# Patient Record
Sex: Male | Born: 1935 | Race: White | Hispanic: No | Marital: Married | Smoking: Never smoker
Health system: Southern US, Community
[De-identification: ages and names within clinical notes are randomized; demographics above are authoritative.]

## PROBLEM LIST (undated history)

## (undated) DIAGNOSIS — E119 Type 2 diabetes mellitus without complications: Secondary | ICD-10-CM

## (undated) DIAGNOSIS — R569 Unspecified convulsions: Secondary | ICD-10-CM

## (undated) DIAGNOSIS — I10 Essential (primary) hypertension: Secondary | ICD-10-CM

## (undated) DIAGNOSIS — I2510 Atherosclerotic heart disease of native coronary artery without angina pectoris: Secondary | ICD-10-CM

## (undated) DIAGNOSIS — Z951 Presence of aortocoronary bypass graft: Secondary | ICD-10-CM

## (undated) HISTORY — PX: KNEE ARTHROSCOPY: SUR90

## (undated) HISTORY — PX: APPENDECTOMY: SHX54

---

## 2010-09-13 ENCOUNTER — Ambulatory Visit: Payer: Self-pay | Admitting: Vascular Surgery

## 2011-05-09 NOTE — Consult Note (Signed)
NEW PATIENT CONSULTATION   Adam Hoffman, Adam Hoffman  DOB:  07/22/1935                                       09/13/2010  CHART#:[id number]   This is a 76 year old male patient referred by Dr. Abran Duke for vascular  evaluation of right leg discomfort.  The patient states that he develops  pain in his right leg below the knee laterally about a third of the way  down the calf.  This occurs with standing and is improved slightly with  walking.  He has no pain while laying in the supine position or sitting.  He has no history of nonhealing ulcers, infection, or gangrene.  He does  have a remote history of bilateral vein stripping done in 2003 and has  had varicose veins for many years.  He denies any history of any severe  swelling since his vein stripping procedure.  He has had no history of  bleeding varicosities or ulcers.   CHRONIC MEDICAL PROBLEMS:  1. Type 2 diabetes mellitus.  2. Hypertension.  3. Hyperlipidemia.  4. Coronary artery disease, previous coronary artery bypass graft in      1997.  5. Bilateral cataracts.  6. Early Parkinson's disease.   SOCIAL HISTORY:  He is married, has 1 child, is retired from a funeral  home.  He has not smoked since 1964.  He does not use alcohol.   FAMILY HISTORY:  Positive for diabetes in his mother, coronary artery  disease in his father.  Negative for stroke.   REVIEW OF SYSTEMS:  Positive for weight loss, arthritis, heart murmur,  tremor in his upper extremities.  All other systems are negative on  review of systems.   PHYSICAL EXAM:  Blood pressure 156/81, heart rate 60, temperature 97.7,  respirations 14.  Generally, he is an elderly well-nourished male who is  in no apparent distress, alert and oriented x3.  HEENT exam is normal.  EOMs intact.  Lungs:  Clear to auscultation.  No rhonchi or wheezing.  Cardiovascular:  Regular rhythm.  No murmurs.  Carotid pulses 3+, no  bruits.  Abdomen:  Soft, nontender with no masses.   Musculoskeletal exam  is free of major deformities.  Neurologic exam reveals a typical pill-  rolling tremor in the right upper extremity consistent with Parkinson's  disease.  Lower extremity exam reveals severe hyperpigmentation of the  lower third of both legs due to chronic venous insufficiency with  multiple varicosities in the calf and thigh bilaterally but no distal  edema and no active ulceration.  He has 3+ femoral, popliteal, and  dorsalis pedis pulses palpable bilaterally.   I reviewed all of the records provided by Dr. Bernette Mayers as well as the  arterial studies performed on Apr 26, 2010, which revealed an ABI on the  right of 0.93 and on the left of 0.82.  I also performed at bedside  venous duplex exam with the SonoSite which revealed no evidence of any  residual saphenous vein in either lower extremities.   I do not think his symptoms are due to arterial or venous disease.  They  could be related to his Parkinson disease or more of a musculoskeletal  problem.  No further vascular workup is indicated.     Quita Skye Hart [place], M.D.  Electronically Signed   JDL/MEDQ  D:  09/13/2010  T:  09/14/2010  Job:  [id number]   cc:   Chinita Greenland, MD

## 2014-08-22 ENCOUNTER — Emergency Department (HOSPITAL_COMMUNITY): Payer: Medicare Other

## 2014-08-22 ENCOUNTER — Encounter (HOSPITAL_COMMUNITY): Payer: Self-pay | Admitting: Emergency Medicine

## 2014-08-22 ENCOUNTER — Observation Stay (HOSPITAL_COMMUNITY)
Admission: EM | Admit: 2014-08-22 | Discharge: 2014-08-23 | Disposition: A | Discharge status: Home / Self Care | Payer: Medicare Other | Attending: Internal Medicine | Admitting: Internal Medicine

## 2014-08-22 DIAGNOSIS — G40909 Epilepsy, unspecified, not intractable, without status epilepticus: Secondary | ICD-10-CM | POA: Insufficient documentation

## 2014-08-22 DIAGNOSIS — X500XXA Overexertion from strenuous movement or load, initial encounter: Secondary | ICD-10-CM | POA: Insufficient documentation

## 2014-08-22 DIAGNOSIS — Z79899 Other long term (current) drug therapy: Secondary | ICD-10-CM | POA: Diagnosis not present

## 2014-08-22 DIAGNOSIS — Z7982 Long term (current) use of aspirin: Secondary | ICD-10-CM | POA: Insufficient documentation

## 2014-08-22 DIAGNOSIS — I10 Essential (primary) hypertension: Secondary | ICD-10-CM | POA: Insufficient documentation

## 2014-08-22 DIAGNOSIS — E785 Hyperlipidemia, unspecified: Secondary | ICD-10-CM | POA: Diagnosis present

## 2014-08-22 DIAGNOSIS — Y9389 Activity, other specified: Secondary | ICD-10-CM | POA: Insufficient documentation

## 2014-08-22 DIAGNOSIS — I480 Paroxysmal atrial fibrillation: Secondary | ICD-10-CM

## 2014-08-22 DIAGNOSIS — I25810 Atherosclerosis of coronary artery bypass graft(s) without angina pectoris: Secondary | ICD-10-CM

## 2014-08-22 DIAGNOSIS — G20 Parkinson's disease: Secondary | ICD-10-CM | POA: Diagnosis present

## 2014-08-22 DIAGNOSIS — S32009A Unspecified fracture of unspecified lumbar vertebra, initial encounter for closed fracture: Principal | ICD-10-CM | POA: Insufficient documentation

## 2014-08-22 DIAGNOSIS — Y9289 Other specified places as the place of occurrence of the external cause: Secondary | ICD-10-CM | POA: Diagnosis not present

## 2014-08-22 DIAGNOSIS — IMO0002 Reserved for concepts with insufficient information to code with codable children: Secondary | ICD-10-CM | POA: Insufficient documentation

## 2014-08-22 DIAGNOSIS — I2510 Atherosclerotic heart disease of native coronary artery without angina pectoris: Secondary | ICD-10-CM | POA: Diagnosis not present

## 2014-08-22 DIAGNOSIS — E119 Type 2 diabetes mellitus without complications: Secondary | ICD-10-CM | POA: Diagnosis present

## 2014-08-22 DIAGNOSIS — G20A1 Parkinson's disease without dyskinesia, without mention of fluctuations: Secondary | ICD-10-CM | POA: Diagnosis present

## 2014-08-22 DIAGNOSIS — I4891 Unspecified atrial fibrillation: Secondary | ICD-10-CM | POA: Diagnosis present

## 2014-08-22 DIAGNOSIS — M549 Dorsalgia, unspecified: Secondary | ICD-10-CM | POA: Diagnosis present

## 2014-08-22 DIAGNOSIS — Z951 Presence of aortocoronary bypass graft: Secondary | ICD-10-CM | POA: Diagnosis not present

## 2014-08-22 DIAGNOSIS — T148XXA Other injury of unspecified body region, initial encounter: Secondary | ICD-10-CM

## 2014-08-22 DIAGNOSIS — S32000A Wedge compression fracture of unspecified lumbar vertebra, initial encounter for closed fracture: Secondary | ICD-10-CM | POA: Diagnosis present

## 2014-08-22 HISTORY — DX: Type 2 diabetes mellitus without complications: E11.9

## 2014-08-22 HISTORY — DX: Presence of aortocoronary bypass graft: Z95.1

## 2014-08-22 HISTORY — DX: Atherosclerotic heart disease of native coronary artery without angina pectoris: I25.10

## 2014-08-22 HISTORY — DX: Unspecified convulsions: R56.9

## 2014-08-22 HISTORY — DX: Essential (primary) hypertension: I10

## 2014-08-22 LAB — COMPREHENSIVE METABOLIC PANEL
ALT: <5 U/L (ref 0–53)
AST: 19 U/L (ref 0–37)
Albumin: 3.3 g/dL — ABNORMAL LOW (ref 3.5–5.2)
Alkaline Phosphatase: 90 U/L (ref 39–117)
Anion gap: 14 (ref 5–15)
BUN: 25 mg/dL — ABNORMAL HIGH (ref 6–23)
CHLORIDE: 97 meq/L (ref 96–112)
CO2: 27 mEq/L (ref 19–32)
Calcium: 9.1 mg/dL (ref 8.4–10.5)
Creatinine, Ser: 1.13 mg/dL (ref 0.50–1.35)
GFR calc Af Amer: 70 mL/min — ABNORMAL LOW (ref >90)
GFR calc non Af Amer: 61 mL/min — ABNORMAL LOW (ref >90)
Glucose, Bld: 132 mg/dL — ABNORMAL HIGH (ref 70–99)
Potassium: 4.7 mEq/L (ref 3.7–5.3)
Sodium: 138 mEq/L (ref 137–147)
Total Protein: 7.1 g/dL (ref 6.0–8.3)

## 2014-08-22 LAB — CBC WITH DIFFERENTIAL/PLATELET
Basophils Absolute: 0 10*3/uL (ref 0.0–0.1)
Basophils Relative: 0 % (ref 0–1)
EOS ABS: 0.3 10*3/uL (ref 0.0–0.7)
Eosinophils Relative: 5 % (ref 0–5)
HCT: 39.8 % (ref 39.0–52.0)
Hemoglobin: 13.8 g/dL (ref 13.0–17.0)
LYMPHS ABS: 0.6 10*3/uL — AB (ref 0.7–4.0)
LYMPHS PCT: 11 % — AB (ref 12–46)
MCH: 33.7 pg (ref 26.0–34.0)
MCHC: 34.7 g/dL (ref 30.0–36.0)
MCV: 97.3 fL (ref 78.0–100.0)
Monocytes Absolute: 0.5 10*3/uL (ref 0.1–1.0)
Monocytes Relative: 10 % (ref 3–12)
NEUTROS PCT: 74 % (ref 43–77)
Neutro Abs: 4 10*3/uL (ref 1.7–7.7)
Platelets: 142 10*3/uL — ABNORMAL LOW (ref 150–400)
RBC: 4.09 MIL/uL — ABNORMAL LOW (ref 4.22–5.81)
RDW: 12.9 % (ref 11.5–15.5)
WBC: 5.3 10*3/uL (ref 4.0–10.5)

## 2014-08-22 LAB — URINALYSIS, ROUTINE W REFLEX MICROSCOPIC
Bilirubin Urine: NEGATIVE
Glucose, UA: NEGATIVE mg/dL
Hgb urine dipstick: NEGATIVE
Ketones, ur: NEGATIVE mg/dL
Leukocytes, UA: NEGATIVE
NITRITE: NEGATIVE
Protein, ur: 30 mg/dL — AB
Specific Gravity, Urine: 1.02 (ref 1.005–1.030)
UROBILINOGEN UA: 0.2 mg/dL (ref 0.0–1.0)
pH: 6 (ref 5.0–8.0)

## 2014-08-22 LAB — PROTIME-INR
INR: 3.32 — ABNORMAL HIGH (ref 0.00–1.49)
PROTHROMBIN TIME: 33.7 s — AB (ref 11.6–15.2)

## 2014-08-22 LAB — URINE MICROSCOPIC-ADD ON

## 2014-08-22 MED ORDER — ONDANSETRON HCL 4 MG/2ML IJ SOLN
4.0000 mg | Freq: Once | INTRAMUSCULAR | Status: AC
  Administered 2014-08-22: 4 mg via INTRAVENOUS
  Filled 2014-08-22: qty 2

## 2014-08-22 MED ORDER — HYDROMORPHONE HCL PF 1 MG/ML IJ SOLN
0.5000 mg | Freq: Once | INTRAMUSCULAR | Status: AC
  Administered 2014-08-22: 0.5 mg via INTRAVENOUS

## 2014-08-22 MED ORDER — HYDROMORPHONE HCL PF 1 MG/ML IJ SOLN
0.5000 mg | Freq: Once | INTRAMUSCULAR | Status: AC
  Administered 2014-08-22: 0.5 mg via INTRAVENOUS
  Filled 2014-08-22: qty 1

## 2014-08-22 NOTE — H&P (Signed)
PCP:   VASIREDDY,SABITHA, MD   Chief Complaint:  Back pain  HPI: 79 year old male who    has a past medical history of Hypertension; Diabetes mellitus without complication; Coronary artery disease; S/P triple vessel bypass; and Seizures. today was brought to the ED for worsening back pain. As per patient he fell a week ago while doing the yard work, and has been having pain. Though he is able to walk with help of walker, the pain is severe when he tries to get up from lying position. The pain radiates to left knee and thigh. He denies losing bowel or bladder control. In the ED a CT of the lumbar spine was done which showed subtle compression fracture of L3. Patient was seen by his primary care provider and prescribed lidocaine patch along with Flexeril 10 mg 3 times a day when necessary and Vicodin. Patient now rates the pain as 3-4/10 in intensity, after he received Dilaudid in the ED. He denies nausea vomiting or diarrhea. No chest shortness of breath.   Allergies:  No Known Allergies    Past Medical History  Diagnosis Date  . Hypertension   . Diabetes mellitus without complication   . Coronary artery disease   . S/P triple vessel bypass   . Seizures     Past Surgical History  Procedure Laterality Date  . Appendectomy    . Knee arthroscopy      Prior to Admission medications   Medication Sig Start Date End Date Taking? Authorizing Provider  amiodarone (PACERONE) 200 MG tablet Take 200 mg by mouth daily.   Yes Historical Provider, MD  aspirin EC 81 MG tablet Take 81 mg by mouth daily.   Yes Historical Provider, MD  carbidopa-levodopa (SINEMET IR) 25-100 MG per tablet Take 2 tablets by mouth 3 (three) times daily.   Yes Historical Provider, MD  citalopram (CELEXA) 20 MG tablet Take 20 mg by mouth daily.   Yes Historical Provider, MD  cyclobenzaprine (FLEXERIL) 10 MG tablet Take 10 mg by mouth 3 (three) times daily as needed for muscle spasms.   Yes Historical Provider, MD    HYDROcodone-acetaminophen (NORCO/VICODIN) 5-325 MG per tablet Take 1 tablet by mouth every 6 (six) hours as needed (pain).   Yes Historical Provider, MD  levothyroxine (SYNTHROID, LEVOTHROID) 50 MCG tablet Take 50 mcg by mouth daily before breakfast.   Yes Historical Provider, MD  lidocaine (LIDODERM) 5 % Place 0.5 patches onto the skin daily. Remove & Discard patch within 12 hours or as directed by MD   Yes Historical Provider, MD  metFORMIN (GLUCOPHAGE) 1000 MG tablet Take 1,000 mg by mouth 2 (two) times daily with a meal.   Yes Historical Provider, MD  phenytoin (DILANTIN) 100 MG ER capsule Take 100 mg by mouth 2 (two) times daily.   Yes Historical Provider, MD  rOPINIRole (REQUIP) 2 MG tablet Take 2 mg by mouth 3 (three) times daily.   Yes Historical Provider, MD  simvastatin (ZOCOR) 10 MG tablet Take 10 mg by mouth daily.   Yes Historical Provider, MD  sodium chloride (MURO 128) 5 % ophthalmic solution Place 1 drop into both eyes at bedtime.   Yes Historical Provider, MD  timolol (TIMOPTIC) 0.5 % ophthalmic solution Place 1 drop into both eyes daily.   Yes Historical Provider, MD  warfarin (COUMADIN) 5 MG tablet Take 5-7.5 mg by mouth at bedtime. Takes 7.5mg  on Tues.,Thurs.,Sat. And  all other days   Yes Historical Provider, MD  Social History:  reports that he has never smoked. He does not have any smokeless tobacco history on file. He reports that he does not drink alcohol or use illicit drugs.    All the positives are listed in BOLD  Review of Systems:  HEENT: Headache, blurred vision, runny nose, sore throat Neck: Hypothyroidism, hyperthyroidism,,lymphadenopathy Chest : Shortness of breath, history of COPD, Asthma Heart : Chest pain, history of coronary arterey disease GI:  Nausea, vomiting, diarrhea, constipation, GERD GU: Dysuria, urgency, frequency of urination, hematuria Neuro: Stroke, seizures, syncope Psych: Depression, anxiety, hallucinations   Physical  Exam: Blood pressure 114/57, pulse 67, temperature 98.2 F (36.8 C), temperature source Oral, resp. rate 16, height 6' (1.829 m), weight 83.008 kg (183 lb), SpO2 94.00%. Constitutional:   Patient is a well-developed and well-nourished male in no acute distress and cooperative with exam. Head: Normocephalic and atraumatic Cardiovascular: RRR, S1 normal, S2 normal Pulmonary/Chest: CTAB, no wheezes, rales, or rhonchi Abdominal: Soft. Non-tender, non-distended, bowel sounds are normal, no masses, organomegaly, or guarding present.  Neurological: A&O x3, Strenght is normal and symmetric bilaterally, cranial nerve II-XII are grossly intact, no focal motor deficit, sensory intact to light touch bilaterally.  Extremities : No Cyanosis, Clubbing or Edema  Labs on Admission:  Basic Metabolic Panel:  Recent Labs Lab 08/22/14 1837  NA 138  K 4.7  CL 97  CO2 27  GLUCOSE 132*  BUN 25*  CREATININE 1.13  CALCIUM 9.1   Liver Function Tests:  Recent Labs Lab 08/22/14 1837  AST 19  ALT <5  ALKPHOS 90  BILITOT <0.2*  PROT 7.1  ALBUMIN 3.3*   No results found for this basename: LIPASE, AMYLASE,  in the last 168 hours No results found for this basename: AMMONIA,  in the last 168 hours CBC:  Recent Labs Lab 08/22/14 1837  WBC 5.3  NEUTROABS 4.0  HGB 13.8  HCT 39.8  MCV 97.3  PLT 142*   CRadiological Exams on Admission: Dg Lumbar Spine Complete  08/22/2014   CLINICAL DATA:  Low back pain, left hip pain and left leg pain for the past week. History of trauma from a fall.  EXAM: LUMBAR SPINE - COMPLETE 4+ VIEW  COMPARISON:  No priors.  FINDINGS: There is a severe compression fracture of T12 with complete loss of anterior vertebral body height. No definite paravertebral soft tissue swelling is appreciated to strongly suggest an acute injury. No other definite acute displaced fractures or compression type fractures are noted. Multilevel degenerative disc disease, most severe at L2-L3. Mild  multilevel facet arthropathy. Median sternotomy wires are noted. Multiple surgical clips project over the upper abdomen.  IMPRESSION: 1. Severe compression fracture of T12 with complete loss of anterior vertebral body height. No surrounding soft tissue thickening to strongly suggest an acute injury, however, clinical correlation is recommended as no prior studies are available for comparison. If there is clinical concern for acute injury at the level of T12, and point tenderness in this region, further evaluation with CT scan may be appropriate.   Electronically Signed   By: Trudie Reed M.D.   On: 08/22/2014 17:46   Ct Lumbar Spine Wo Contrast  08/22/2014   CLINICAL DATA:  Low back pain. Recent history of fall. Abnormal lumbar spine radiograph.  EXAM: CT LUMBAR SPINE WITHOUT CONTRAST  TECHNIQUE: Multidetector CT imaging of the lumbar spine was performed without intravenous contrast administration. Multiplanar CT image reconstructions were also generated.  COMPARISON:  Lumbar spine radiograph 08/22/2014.  FINDINGS: There  is a very subtle compression fracture of the L3 vertebral body, with less than 10% loss of vertebral body height. No other acute displaced fractures or other acute compression type fractures are noted. 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 Multilevel degenerative disc disease, most severe at L2-L3. Multilevel facet arthropathy.  Visualized portions of the intra-abdominal contents are generally unremarkable, although there is extensive calcified atherosclerotic plaque in the visualized vasculature, and there is an exophytic 1.6 cm low-attenuation lesion in the upper pole of the left kidney which is incompletely  characterized on this noncontrast CT examination, but statistically likely a small cyst.  IMPRESSION: 1. 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 2. However, there is a very subtle compression fracture of L3 with less than 10% loss of vertebral body height. 3. Mild multilevel degenerative disc disease and lumbar spondylosis. 4. Atherosclerosis. These results were called by telephone at the time of interpretation on 08/22/2014 at 7:25 pm to Dr. Bethann Berkshire, who verbally acknowledged these results.   Electronically Signed   By: Trudie Reed M.D.   On: 08/22/2014 19:28      Assessment/Plan Active Problems:   Compression fracture   Atrial fibrillation   CAD (coronary artery disease)   Hyperlipidemia  Back pain   patient has subtle compression fracture of L3, mild multilevel degenerative disc disease and lumbar spondylosis. We'll start the patient on Dilaudid 1 mg every 4 hours when necessary continue Flexeril and Lidoderm patch. We'll get physical therapy consultation. Patient might need home health PT at the time of discharge.  CAD Patient has history of CAD, stable , continue home  medications including aspirin, Zocor.  Diabetes mellitus Hold metformin and start sliding scale insulin with NovoLog.  Atrial fibrillation Continue the amiodarone 200 mg daily, warfarin per pharmacy consultation  Hyperlipidemia Continue Zocor   Code status: patient is full code   Family discussion: Admission, patients condition and plan of care including tests being ordered have been discussed with the patient and *his wife at bedside who indicate understanding and agree with the plan and Code Status.   Time Spent on Admission:  60 minutes  Dan Dissinger S [facility] Pager: [phone] 08/22/2014, 11:02 PM  If 7PM-7AM, please contact night-coverage  [email or web address]  Password [email or web address]

## 2014-08-22 NOTE — ED Provider Notes (Signed)
CSN: [id number]     Arrival date & time 08/22/14  1628 History  This chart was scribed for Benny Lennert, MD by Modena Jansky, ED Scribe. This patient was seen in room APA12/APA12 and the patient's care was started at 6:21 PM.   Chief Complaint  Patient presents with  . Back Pain   Patient is a 79 y.o. male presenting with back pain. The history is provided by the patient and the spouse. No language interpreter was used.  Back Pain Location:  Generalized Radiates to:  L knee and L thigh Pain severity:  Moderate Onset quality:  Sudden Duration:  1 week Timing:  Constant Progression:  Unchanged Chronicity:  New Context: physical stress   Relieved by:  Nothing Worsened by:  Bending Ineffective treatments:  Muscle relaxants, OTC medications and NSAIDs Associated symptoms: no abdominal pain, no chest pain and no headaches    HPI Comments: Adam Hoffman is a 79 y.o. male who presents to the Emergency Department complaining of constant moderate back pain that started about a week ago. He states that the pain started when he was bending over in the yard. Pt states that he also has pain to his left lower extremity. He states that the LLE pain starts in his left knee cap and radiates upwards. He reports that getting out of his power chair exacerbates the back pain. His spouse reports that his PCP prescribed him hydrocodone, flexeril, and acetaminophen with no relief. She states that the x rays done that day did not show anything abnormal. She reports that pt has a hx of a fall in 2013 where pt had a compression fracture in his back. Pt denies any urinary symptoms.   Past Medical History  Diagnosis Date  . Hypertension   . Diabetes mellitus without complication   . Coronary artery disease   . S/P triple vessel bypass   . Seizures    Past Surgical History  Procedure Laterality Date  . Appendectomy    . Knee arthroscopy     History reviewed. No pertinent family history. History   Substance Use Topics  . Smoking status: Never Smoker   . Smokeless tobacco: Not on file  . Alcohol Use: No    Review of Systems  Constitutional: Negative for appetite change and fatigue.  HENT: Negative for congestion, ear discharge and sinus pressure.   Eyes: Negative for discharge.  Respiratory: Negative for cough.   Cardiovascular: Negative for chest pain.  Gastrointestinal: Negative for abdominal pain and diarrhea.  Genitourinary: Negative for frequency and hematuria.  Musculoskeletal: Positive for back pain and myalgias.  Skin: Negative for rash.  Neurological: Negative for seizures and headaches.  Psychiatric/Behavioral: Negative for hallucinations.    Allergies  Review of patient's allergies indicates no known allergies.  Home Medications   Prior to Admission medications   Medication Sig Start Date End Date Taking? Authorizing Provider  amiodarone (PACERONE) 200 MG tablet Take 200 mg by mouth daily.   Yes Historical Provider, MD  aspirin EC 81 MG tablet Take 81 mg by mouth daily.   Yes Historical Provider, MD  carbidopa-levodopa (SINEMET IR) 25-100 MG per tablet Take 2 tablets by mouth 3 (three) times daily.   Yes Historical Provider, MD  citalopram (CELEXA) 20 MG tablet Take 20 mg by mouth daily.   Yes Historical Provider, MD  cyclobenzaprine (FLEXERIL) 10 MG tablet Take 10 mg by mouth 3 (three) times daily as needed for muscle spasms.   Yes Historical Provider, MD  HYDROcodone-acetaminophen (  NORCO/VICODIN) 5-325 MG per tablet Take 1 tablet by mouth every 6 (six) hours as needed (pain).   Yes Historical Provider, MD  levothyroxine (SYNTHROID, LEVOTHROID) 50 MCG tablet Take 50 mcg by mouth daily before breakfast.   Yes Historical Provider, MD  lidocaine (LIDODERM) 5 % Place 0.5 patches onto the skin daily. Remove & Discard patch within 12 hours or as directed by MD   Yes Historical Provider, MD  metFORMIN (GLUCOPHAGE) 1000 MG tablet Take 1,000 mg by mouth 2 (two) times  daily with a meal.   Yes Historical Provider, MD  phenytoin (DILANTIN) 100 MG ER capsule Take 100 mg by mouth 2 (two) times daily.   Yes Historical Provider, MD  rOPINIRole (REQUIP) 2 MG tablet Take 2 mg by mouth 3 (three) times daily.   Yes Historical Provider, MD  simvastatin (ZOCOR) 10 MG tablet Take 10 mg by mouth daily.   Yes Historical Provider, MD  sodium chloride (MURO 128) 5 % ophthalmic solution Place 1 drop into both eyes at bedtime.   Yes Historical Provider, MD  timolol (TIMOPTIC) 0.5 % ophthalmic solution Place 1 drop into both eyes daily.   Yes Historical Provider, MD  warfarin (COUMADIN) 5 MG tablet Take 5-7.5 mg by mouth at bedtime. Takes 7.5mg  on Tues.,Thurs.,Sat. And  all other days   Yes Historical Provider, MD   BP 115/62  Pulse 73  Temp(Src) 98.2 F (36.8 C) (Oral)  Resp 18  Ht 6' (1.829 m)  Wt 183 lb (83.008 kg)  BMI 24.81 kg/m2  SpO2 96% Physical Exam  Constitutional: He is oriented to person, place, and time. He appears well-developed.  HENT:  Head: Normocephalic.  Eyes: Conjunctivae and EOM are normal. No scleral icterus.  Neck: Neck supple. No thyromegaly present.  Cardiovascular: Normal rate and regular rhythm.  Exam reveals no gallop and no friction rub.   No murmur heard. Pulmonary/Chest: No stridor. He has no wheezes. He has no rales. He exhibits no tenderness.  Abdominal: He exhibits no distension. There is no tenderness. There is no rebound.  Musculoskeletal: Normal range of motion. He exhibits tenderness. He exhibits no edema.  Moderate lower lumbar spine TTP. Moderate weakness all throughout.   Lymphadenopathy:    He has no cervical adenopathy.  Neurological: He is oriented to person, place, and time. He exhibits normal muscle tone. Coordination normal.  Skin: No rash noted. No erythema.  Psychiatric: He has a normal mood and affect. His behavior is normal.    ED Course  Procedures (including critical care time) DIAGNOSTIC STUDIES: Oxygen  Saturation is 96% on RA, normal by my interpretation.    COORDINATION OF CARE: 6:25 PM- Pt advised of plan for treatment which includes medication, radiology, and labs and pt agrees.  Labs Review Labs Reviewed  CBC WITH DIFFERENTIAL - Abnormal; Notable for the following:    RBC 4.09 (*)    Platelets 142 (*)    Lymphocytes Relative 11 (*)    Lymphs Abs 0.6 (*)    All other components within normal limits  PROTIME-INR - Abnormal; Notable for the following:    Prothrombin Time 33.7 (*)    INR 3.32 (*)    All other components within normal limits  COMPREHENSIVE METABOLIC PANEL - Abnormal; Notable for the following:    Glucose, Bld 132 (*)    BUN 25 (*)    Albumin 3.3 (*)    Total Bilirubin <0.2 (*)    GFR calc non Af Amer 61 (*)  GFR calc Af Amer 70 (*)    All other components within normal limits  URINALYSIS, ROUTINE W REFLEX MICROSCOPIC    Imaging Review Dg Lumbar Spine Complete  08/22/2014   CLINICAL DATA:  Low back pain, left hip pain and left leg pain for the past week. History of trauma from a fall.  EXAM: LUMBAR SPINE - COMPLETE 4+ VIEW  COMPARISON:  No priors.  FINDINGS: There is a severe compression fracture of T12 with complete loss of anterior vertebral body height. No definite paravertebral soft tissue swelling is appreciated to strongly suggest an acute injury. No other definite acute displaced fractures or compression type fractures are noted. Multilevel degenerative disc disease, most severe at L2-L3. Mild multilevel facet arthropathy. Median sternotomy wires are noted. Multiple surgical clips project over the upper abdomen.  IMPRESSION: 1. Severe compression fracture of T12 with complete loss of anterior vertebral body height. No surrounding soft tissue thickening to strongly suggest an acute injury, however, clinical correlation is recommended as no prior studies are available for comparison. If there is clinical concern for acute injury at the level of T12, and point  tenderness in this region, further evaluation with CT scan may be appropriate.   Electronically Signed   By: Trudie Reed M.D.   On: 08/22/2014 17:46   Ct Lumbar Spine Wo Contrast  08/22/2014   CLINICAL DATA:  Low back pain. Recent history of fall. Abnormal lumbar spine radiograph.  EXAM: CT LUMBAR SPINE WITHOUT CONTRAST  TECHNIQUE: Multidetector CT imaging of the lumbar spine was performed without intravenous contrast administration. Multiplanar CT image reconstructions were also generated.  COMPARISON:  Lumbar spine radiograph 08/22/2014.  FINDINGS: There is a very subtle compression fracture of the L3 vertebral body, with less than 10% loss of vertebral body height. No other acute displaced fractures or other acute compression type fractures are noted. 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 Multilevel degenerative disc disease, most severe at L2-L3. Multilevel facet arthropathy.  Visualized portions of the intra-abdominal contents are generally unremarkable, although there is extensive calcified atherosclerotic plaque in the visualized vasculature, and there is an exophytic 1.6 cm low-attenuation lesion in the upper pole of the left kidney which is incompletely characterized on this noncontrast CT examination, but statistically likely a small cyst.  IMPRESSION: 1. 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 2. However, there is a very subtle compression fracture of L3 with less than 10% loss of vertebral body height. 3. Mild multilevel degenerative disc disease and lumbar spondylosis. 4. Atherosclerosis. These results were called by  telephone at the time of interpretation on 08/22/2014 at 7:25 pm to Dr. Bethann Berkshire, who verbally acknowledged these results.   Electronically Signed   By: Trudie Reed M.D.   On: 08/22/2014 19:28     EKG Interpretation None      MDM   Final diagnoses:  None    The chart was scribed for me under my direct supervision.  I personally performed the history, physical, and medical decision making and all procedures in the evaluation of this patient.Benny Lennert, MD 08/22/14 2231

## 2014-08-22 NOTE — ED Notes (Signed)
Pt reports back pain radiating to LLE since bending over while in the yard last Saturday. Pt reports seen for same last night at [facility]. Pt reports no relief. nad noted. Pt denies any gu/gi symptoms.

## 2014-08-22 NOTE — ED Notes (Signed)
Pt in xray at time of being roomed.

## 2014-08-23 ENCOUNTER — Encounter (HOSPITAL_COMMUNITY): Payer: Self-pay | Admitting: Emergency Medicine

## 2014-08-23 DIAGNOSIS — G20 Parkinson's disease: Secondary | ICD-10-CM | POA: Diagnosis present

## 2014-08-23 DIAGNOSIS — M549 Dorsalgia, unspecified: Secondary | ICD-10-CM | POA: Diagnosis present

## 2014-08-23 DIAGNOSIS — E119 Type 2 diabetes mellitus without complications: Secondary | ICD-10-CM | POA: Diagnosis present

## 2014-08-23 DIAGNOSIS — S32009A Unspecified fracture of unspecified lumbar vertebra, initial encounter for closed fracture: Secondary | ICD-10-CM | POA: Diagnosis not present

## 2014-08-23 DIAGNOSIS — S32000A Wedge compression fracture of unspecified lumbar vertebra, initial encounter for closed fracture: Secondary | ICD-10-CM | POA: Diagnosis present

## 2014-08-23 LAB — CBC
HEMATOCRIT: 39.7 % (ref 39.0–52.0)
HEMOGLOBIN: 13.5 g/dL (ref 13.0–17.0)
MCH: 33.2 pg (ref 26.0–34.0)
MCHC: 34 g/dL (ref 30.0–36.0)
MCV: 97.5 fL (ref 78.0–100.0)
Platelets: 153 10*3/uL (ref 150–400)
RBC: 4.07 MIL/uL — AB (ref 4.22–5.81)
RDW: 12.9 % (ref 11.5–15.5)
WBC: 4.5 10*3/uL (ref 4.0–10.5)

## 2014-08-23 LAB — GLUCOSE, CAPILLARY
Glucose-Capillary: 122 mg/dL — ABNORMAL HIGH (ref 70–99)
Glucose-Capillary: 131 mg/dL — ABNORMAL HIGH (ref 70–99)

## 2014-08-23 LAB — COMPREHENSIVE METABOLIC PANEL
ALBUMIN: 3.2 g/dL — AB (ref 3.5–5.2)
ALK PHOS: 90 U/L (ref 39–117)
ALT: 8 U/L (ref 0–53)
ANION GAP: 13 (ref 5–15)
AST: 17 U/L (ref 0–37)
BILIRUBIN TOTAL: 0.2 mg/dL — AB (ref 0.3–1.2)
BUN: 23 mg/dL (ref 6–23)
CHLORIDE: 98 meq/L (ref 96–112)
CO2: 28 mEq/L (ref 19–32)
Calcium: 8.9 mg/dL (ref 8.4–10.5)
Creatinine, Ser: 0.97 mg/dL (ref 0.50–1.35)
GFR, EST AFRICAN AMERICAN: 90 mL/min — AB (ref >90)
GFR, EST NON AFRICAN AMERICAN: 78 mL/min — AB (ref >90)
GLUCOSE: 114 mg/dL — AB (ref 70–99)
Potassium: 5 mEq/L (ref 3.7–5.3)
Sodium: 139 mEq/L (ref 137–147)
Total Protein: 6.9 g/dL (ref 6.0–8.3)

## 2014-08-23 LAB — PROTIME-INR
INR: 2.62 — AB (ref 0.00–1.49)
Prothrombin Time: 28 seconds — ABNORMAL HIGH (ref 11.6–15.2)

## 2014-08-23 MED ORDER — PHENYTOIN SODIUM EXTENDED 100 MG PO CAPS
100.0000 mg | ORAL_CAPSULE | Freq: Two times a day (BID) | ORAL | Status: DC
  Administered 2014-08-23 (×2): 100 mg via ORAL
  Filled 2014-08-23 (×2): qty 1

## 2014-08-23 MED ORDER — ASPIRIN EC 81 MG PO TBEC
81.0000 mg | DELAYED_RELEASE_TABLET | Freq: Every day | ORAL | Status: DC
  Administered 2014-08-23: 81 mg via ORAL
  Filled 2014-08-23: qty 1

## 2014-08-23 MED ORDER — CYCLOBENZAPRINE HCL 10 MG PO TABS: 10.0000 mg | ORAL_TABLET | Freq: Three times a day (TID) | ORAL | Status: DC | PRN

## 2014-08-23 MED ORDER — SODIUM CHLORIDE 0.9 % IV SOLN: 250.0000 mL | INTRAVENOUS | Status: DC | PRN

## 2014-08-23 MED ORDER — SODIUM CHLORIDE (HYPERTONIC) 5 % OP SOLN
1.0000 [drp] | Freq: Every day | OPHTHALMIC | Status: DC
  Filled 2014-08-23: qty 15

## 2014-08-23 MED ORDER — LIDOCAINE 5 % EX PTCH
0.5000 | MEDICATED_PATCH | CUTANEOUS | Status: DC
  Administered 2014-08-23: 0.5 via TRANSDERMAL
  Filled 2014-08-23 (×2): qty 1

## 2014-08-23 MED ORDER — INSULIN ASPART 100 UNIT/ML ~~LOC~~ SOLN
0.0000 [IU] | Freq: Three times a day (TID) | SUBCUTANEOUS | Status: DC
  Administered 2014-08-23 (×2): 1 [IU] via SUBCUTANEOUS

## 2014-08-23 MED ORDER — HYDROCODONE-ACETAMINOPHEN 5-325 MG PO TABS: 2.0000 | ORAL_TABLET | ORAL | Status: AC | PRN

## 2014-08-23 MED ORDER — CITALOPRAM HYDROBROMIDE 20 MG PO TABS
20.0000 mg | ORAL_TABLET | Freq: Every day | ORAL | Status: DC
  Administered 2014-08-23: 20 mg via ORAL
  Filled 2014-08-23: qty 1

## 2014-08-23 MED ORDER — WARFARIN - PHARMACIST DOSING INPATIENT: Status: DC

## 2014-08-23 MED ORDER — TIMOLOL MALEATE 0.5 % OP SOLN
1.0000 [drp] | Freq: Every day | OPHTHALMIC | Status: DC
  Administered 2014-08-23: 1 [drp] via OPHTHALMIC
  Filled 2014-08-23: qty 5

## 2014-08-23 MED ORDER — SODIUM CHLORIDE 0.9 % IJ SOLN: 3.0000 mL | INTRAMUSCULAR | Status: DC | PRN

## 2014-08-23 MED ORDER — DOCUSATE SODIUM 100 MG PO CAPS: 100.0000 mg | ORAL_CAPSULE | Freq: Two times a day (BID) | ORAL | Status: AC | PRN

## 2014-08-23 MED ORDER — WARFARIN SODIUM 5 MG PO TABS
5.0000 mg | ORAL_TABLET | Freq: Once | ORAL | Status: AC
  Administered 2014-08-23: 5 mg via ORAL
  Filled 2014-08-23: qty 1

## 2014-08-23 MED ORDER — ROPINIROLE HCL 1 MG PO TABS
2.0000 mg | ORAL_TABLET | Freq: Three times a day (TID) | ORAL | Status: DC
  Administered 2014-08-23 (×2): 2 mg via ORAL
  Filled 2014-08-23 (×4): qty 2

## 2014-08-23 MED ORDER — NAPROXEN 375 MG PO TABS: 375.0000 mg | ORAL_TABLET | Freq: Two times a day (BID) | ORAL | Status: AC

## 2014-08-23 MED ORDER — SODIUM CHLORIDE 0.9 % IJ SOLN
3.0000 mL | Freq: Two times a day (BID) | INTRAMUSCULAR | Status: DC
  Administered 2014-08-23 (×2): 3 mL via INTRAVENOUS

## 2014-08-23 MED ORDER — AMIODARONE HCL 200 MG PO TABS
200.0000 mg | ORAL_TABLET | Freq: Every day | ORAL | Status: DC
  Administered 2014-08-23: 200 mg via ORAL
  Filled 2014-08-23: qty 1

## 2014-08-23 MED ORDER — CARBIDOPA-LEVODOPA 25-100 MG PO TABS
2.0000 | ORAL_TABLET | Freq: Three times a day (TID) | ORAL | Status: DC
  Administered 2014-08-23 (×2): 2 via ORAL
  Filled 2014-08-23 (×2): qty 2

## 2014-08-23 MED ORDER — LEVOTHYROXINE SODIUM 50 MCG PO TABS
50.0000 ug | ORAL_TABLET | Freq: Every day | ORAL | Status: DC
  Administered 2014-08-23: 50 ug via ORAL
  Filled 2014-08-23: qty 1

## 2014-08-23 MED ORDER — HYDROMORPHONE HCL PF 1 MG/ML IJ SOLN
1.0000 mg | INTRAMUSCULAR | Status: DC | PRN
  Administered 2014-08-23: 1 mg via INTRAVENOUS
  Filled 2014-08-23: qty 1

## 2014-08-23 MED ORDER — LIDOCAINE 5 % EX PTCH
MEDICATED_PATCH | CUTANEOUS | Status: AC
  Filled 2014-08-23: qty 1

## 2014-08-23 MED ORDER — SIMVASTATIN 10 MG PO TABS
10.0000 mg | ORAL_TABLET | Freq: Every day | ORAL | Status: DC
  Administered 2014-08-23: 10 mg via ORAL
  Filled 2014-08-23 (×2): qty 1

## 2014-08-23 NOTE — Discharge Instructions (Signed)
Vertebral Fracture  You have a fracture of one or more vertebra. These are the bony parts that form the spine. Minor vertebral fractures happen when people fall. Osteoporosis is associated with many of these fractures. Hospital care may not be necessary for minor compression fractures that are stable. However, multiple fractures of the spine or unstable injuries can cause severe pain and even damage the spinal cord. A spinal cord injury may cause paralysis, numbness, or loss of normal bowel and bladder control.   Normally there is pain and stiffness in the back for 3 to 6 weeks after a vertebral fracture. Bed rest for several days, pain medicine, and a slow return to activity is often the only treatment that is needed depending on the location of the fracture. Neck and back braces may be helpful in reducing pain and increasing mobility. When your pain allows, you should begin walking or swimming to help maintain your endurance. Exercises to improve motion and to strengthen the back may also be useful after the initial pain improves. Treatment for osteoporosis may be essential for full recovery. This will help reduce your risk of vertebral fractures with a future fall.  During the first few days after a spine fracture you may feel nauseated or vomit. If this is severe, hospital care with IV fluids will be needed.   Arrange for follow-up care as recommended to assure proper long-term care and prevention of further spine injury.   SEEK IMMEDIATE MEDICAL CARE IF:   You have increasing pain, vomiting, or are unable to move around at all.   You develop numbness, tingling, weakness, or paralysis of any part of your body.   You develop a loss of normal bowel or bladder control.   You have difficulty breathing, cough, fever, chest or abdominal pain.  MAKE SURE YOU:    Understand these instructions.   Will watch your condition.   Will get help right away if you are not doing well or get worse.  Document Released:  01/18/2005 Document Revised: 03/04/2012 Document Reviewed: 08/03/2009  [employer] Patient Information 2015 [employer]. This information is not intended to replace advice given to you by your health care provider. Make sure you discuss any questions you have with your health care provider.

## 2014-08-23 NOTE — Discharge Summary (Addendum)
Physician Discharge Summary  Adam Hoffman:[id number] DOB: 24-Apr-1935 DOA: 08/22/2014  PCP: Craig Staggers, MD  Admit date: 08/22/2014 Discharge date: 08/23/2014  Time spent: 25 minutes  Recommendations for Outpatient Follow-up:  1. Discharge home with home health PT  Discharge Diagnoses:  Principal Problem:   Lumbar compression fracture  Active Problems:   Atrial fibrillation   CAD (coronary artery disease)   Hyperlipidemia   Parkinson disease   Back pain without radiation   Type II or unspecified type diabetes mellitus without mention of complication, not stated as uncontrolled   Discharge Condition: Fair  Diet recommendation: Diabetic  Filed Weights   08/22/14 1637 08/23/14 0112  Weight: 83.008 kg (183 lb) 82.237 kg (181 lb 4.8 oz)    History of present illness:  Please refer to admission H&P for details, but in brief,79 year old male who has a past medical history of Hypertension; Diabetes mellitus without complication; Coronary artery disease; S/P triple vessel bypass; and Seizures  was brought to the ED for worsening back pain. As per patient he fell a week ago while doing the yard work, and has been having pain. Though he is able to walk with help of walker, the pain is severe when he tries to get up from lying position. The pain radiated  to left knee and thigh. He denies bowel or bladder symptoms.. In the ED a CT of the lumbar spine was done which showed subtle compression fracture of L3. Patient was seen by his primary care provider 3 days at admission and prescribed lidocaine patch along with Flexeril 10 mg 3 times a day when necessary and Vicodin.  Patient denied any other symptoms and was admitted to medical floor for observation and pain control   Hospital Course:  Back pain secondary to L3 compression fracture patient has subtle compression fracture of L3, mild multilevel degenerative disc disease and lumbar spondylosis. -patient placed on on Dilaudid 1  mg every 4 hours when necessary continue Flexeril and Lidoderm patch. Seen by physical therapy and recommended home health PT. Patient was able to ablate with the help of a walker His pain is much improved now and he wishes to go home. I will increase his Vicodin dose to 5/325 mg, 2 tablets every 4 hours as needed (was receiving 1 tablet every 6 hours as needed ) at home. I would also add Naprosyn 325 mg twice daily for 5 days . -follow up with his PCP in one week. -Arrange home health PT upon discharge  CAD / history of CABG stable . Continue aspirin and statin.   Diabetes mellitus  Continue metformin  Atrial fibrillation  Rate controlled. Continue warfarin and amiodarone  Hyperlipidemia  Continue Zocor      Procedures:  None  Consultations:  None   Diet: Diabetic CODE STATUS: Full code  Family communication Discussed with wife at bedside Discharge Exam: Filed Vitals:   08/23/14 1511  BP: 135/65  Pulse: 65  Temp: 98.3 F (36.8 C)  Resp: 18    General: Elderly male lying in bed in no acute distress HEENT: No pallor, moist oral mucosa Chest: Clear to auscultation bilaterally CVS: S1 and S2 irregular, no murmurs Abdomen: Soft, nontender, nondistended, bowel sounds present Extremities: Warm, SLTR negative, mild low back tenderness, normal sensations in bilateral lower extremities CNS: Alert and oriented  Discharge Instructions You were cared for by a hospitalist during your hospital stay. If you have any questions about your discharge medications or the care you received while you were in the  hospital after you are discharged, you can call the unit and asked to speak with the hospitalist on call if the hospitalist that took care of you is not available. Once you are discharged, your primary care physician will handle any further medical issues. Please note that NO REFILLS for any discharge medications will be authorized once you are discharged, as it is imperative  that you return to your primary care physician (or establish a relationship with a primary care physician if you do not have one) for your aftercare needs so that they can reassess your need for medications and monitor your lab values.     Medication List         amiodarone 200 MG tablet  Commonly known as:  PACERONE  Take 200 mg by mouth daily.     aspirin EC 81 MG tablet  Take 81 mg by mouth daily.     carbidopa-levodopa 25-100 MG per tablet  Commonly known as:  SINEMET IR  Take 2 tablets by mouth 3 (three) times daily.     citalopram 20 MG tablet  Commonly known as:  CELEXA  Take 20 mg by mouth daily.     cyclobenzaprine 10 MG tablet  Commonly known as:  FLEXERIL  Take 10 mg by mouth 3 (three) times daily as needed for muscle spasms.     docusate sodium 100 MG capsule  Commonly known as:  COLACE  Take 1 capsule (100 mg total) by mouth 2 (two) times daily as needed for mild constipation.     HYDROcodone-acetaminophen 5-325 MG per tablet  Commonly known as:  NORCO/VICODIN  Take 2 tablets by mouth every 4 (four) hours as needed for moderate pain (pain).     levothyroxine 50 MCG tablet  Commonly known as:  SYNTHROID, LEVOTHROID  Take 50 mcg by mouth daily before breakfast.     lidocaine 5 %  Commonly known as:  LIDODERM  Place 0.5 patches onto the skin daily. Remove & Discard patch within 12 hours or as directed by MD     metFORMIN 1000 MG tablet  Commonly known as:  GLUCOPHAGE  Take 1,000 mg by mouth 2 (two) times daily with a meal.     naproxen 375 MG tablet  Commonly known as:  NAPROSYN  Take 1 tablet (375 mg total) by mouth 2 (two) times daily with a meal.     phenytoin 100 MG ER capsule  Commonly known as:  DILANTIN  Take 100 mg by mouth 2 (two) times daily.     rOPINIRole 2 MG tablet  Commonly known as:  REQUIP  Take 2 mg by mouth 3 (three) times daily.     simvastatin 10 MG tablet  Commonly known as:  ZOCOR  Take 10 mg by mouth daily.     sodium  chloride 5 % ophthalmic solution  Commonly known as:  MURO 128  Place 1 drop into both eyes at bedtime.     timolol 0.5 % ophthalmic solution  Commonly known as:  TIMOPTIC  Place 1 drop into both eyes daily.     warfarin 5 MG tablet  Commonly known as:  COUMADIN  Take 5-7.5 mg by mouth at bedtime. Takes 7.5mg  on Tues.,Thurs.,Sat. And  all other days       No Known Allergies     Follow-up Information   Follow up with VASIREDDY,SABITHA, MD. Schedule an appointment as soon as possible for a visit in 1 week.   Specialty:  Internal Medicine  Contact information:   [address] [place] [phone]        The results of significant diagnostics from this hospitalization (including imaging, microbiology, ancillary and laboratory) are listed below for reference.    Significant Diagnostic Studies: Dg Lumbar Spine Complete  08/22/2014   CLINICAL DATA:  Low back pain, left hip pain and left leg pain for the past week. History of trauma from a fall.  EXAM: LUMBAR SPINE - COMPLETE 4+ VIEW  COMPARISON:  No priors.  FINDINGS: There is a severe compression fracture of T12 with complete loss of anterior vertebral body height. No definite paravertebral soft tissue swelling is appreciated to strongly suggest an acute injury. No other definite acute displaced fractures or compression type fractures are noted. Multilevel degenerative disc disease, most severe at L2-L3. Mild multilevel facet arthropathy. Median sternotomy wires are noted. Multiple surgical clips project over the upper abdomen.  IMPRESSION: 1. Severe compression fracture of T12 with complete loss of anterior vertebral body height. No surrounding soft tissue thickening to strongly suggest an acute injury, however, clinical correlation is recommended as no prior studies are available for comparison. If there is clinical concern for acute injury at the level of T12, and point tenderness in this region, further evaluation with CT  scan may be appropriate.   Electronically Signed   By: Trudie Reed M.D.   On: 08/22/2014 17:46   Ct Lumbar Spine Wo Contrast  08/22/2014   CLINICAL DATA:  Low back pain. Recent history of fall. Abnormal lumbar spine radiograph.  EXAM: CT LUMBAR SPINE WITHOUT CONTRAST  TECHNIQUE: Multidetector CT imaging of the lumbar spine was performed without intravenous contrast administration. Multiplanar CT image reconstructions were also generated.  COMPARISON:  Lumbar spine radiograph 08/22/2014.  FINDINGS: There is a very subtle compression fracture of the L3 vertebral body, with less than 10% loss of vertebral body height. No other acute displaced fractures or other acute compression type fractures are noted. 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 Multilevel degenerative disc disease, most severe at L2-L3. Multilevel facet arthropathy.  Visualized portions of the intra-abdominal contents are generally unremarkable, although there is extensive calcified atherosclerotic plaque in the visualized vasculature, and there is an exophytic 1.6 cm low-attenuation lesion in the upper pole of the left kidney which is incompletely characterized on this noncontrast CT examination, but statistically likely a small cyst.  IMPRESSION: 1. 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 2. However, there is a very subtle compression fracture of L3 with less than 10% loss of vertebral body height. 3. Mild multilevel degenerative disc disease and lumbar spondylosis. 4. Atherosclerosis. These results were called by telephone at the time of interpretation on 08/22/2014 at 7:25  pm to Dr. Bethann Berkshire, who verbally acknowledged these results.   Electronically Signed   By: Trudie Reed M.D.   On: 08/22/2014 19:28    Microbiology: No results found for this or any previous visit (from the past 240 hour(s)).   Labs: Basic Metabolic Panel:  Recent Labs Lab 08/22/14 1837 08/23/14 0607  NA 138 139  K 4.7 5.0  CL 97 98  CO2 27 28  GLUCOSE 132* 114*  BUN 25* 23  CREATININE 1.13 0.97  CALCIUM 9.1 8.9   Liver Function Tests:  Recent Labs Lab 08/22/14 1837 08/23/14 0607  AST 19 17  ALT <5 8  ALKPHOS 90 90  BILITOT <0.2* 0.2*  PROT 7.1 6.9  ALBUMIN 3.3* 3.2*   No results found for this basename: LIPASE, AMYLASE,  in the last 168 hours No results found for this basename: AMMONIA,  in the last 168 hours CBC:  Recent Labs Lab 08/22/14 1837 08/23/14 0607  WBC 5.3 4.5  NEUTROABS 4.0  --   HGB 13.8 13.5  HCT 39.8 39.7  MCV 97.3 97.5  PLT 142* 153   Cardiac Enzymes: No results found for this basename: CKTOTAL, CKMB, CKMBINDEX, TROPONINI,  in the last 168 hours BNP: BNP (last 3 results) No results found for this basename: PROBNP,  in the last 8760 hours CBG:  Recent Labs Lab 08/23/14 0729 08/23/14 1131  GLUCAP 122* 131*       Signed:  Noble Cicalese  [facility] 08/23/2014, 3:31 PM

## 2014-08-23 NOTE — Progress Notes (Addendum)
ANTICOAGULATION CONSULT NOTE  Pharmacy Consult for Warfarin Indication: atrial fibrillation  No Known Allergies  Patient Measurements: Height: 6' (182.9 cm) Weight: 181 lb 4.8 oz (82.237 kg) IBW/kg (Calculated) : 77.6  Vital Signs: Temp: 98.4 F (36.9 C) (08/30 0646) Temp src: Oral (08/30 0646) BP: 132/64 mmHg (08/30 0646) Pulse Rate: 63 (08/30 0646)  Labs:  Recent Labs  08/22/14 1837 08/23/14 0607 08/23/14 1238  HGB 13.8 13.5  --   HCT 39.8 39.7  --   PLT 142* 153  --   LABPROT 33.7*  --  28.0*  INR 3.32*  --  2.62*  CREATININE 1.13 0.97  --     Estimated Creatinine Clearance: 70 ml/min (by C-G formula based on Cr of 0.97).   Medical History: Past Medical History  Diagnosis Date  . Hypertension   . Diabetes mellitus without complication   . Coronary artery disease   . S/P triple vessel bypass   . Seizures     Medications:  Prescriptions prior to admission  Medication Sig Dispense Refill  . amiodarone (PACERONE) 200 MG tablet Take 200 mg by mouth daily.      Marland Kitchen aspirin EC 81 MG tablet Take 81 mg by mouth daily.      . carbidopa-levodopa (SINEMET IR) 25-100 MG per tablet Take 2 tablets by mouth 3 (three) times daily.      . citalopram (CELEXA) 20 MG tablet Take 20 mg by mouth daily.      . cyclobenzaprine (FLEXERIL) 10 MG tablet Take 10 mg by mouth 3 (three) times daily as needed for muscle spasms.      Marland Kitchen HYDROcodone-acetaminophen (NORCO/VICODIN) 5-325 MG per tablet Take 1 tablet by mouth every 6 (six) hours as needed (pain).      Marland Kitchen levothyroxine (SYNTHROID, LEVOTHROID) 50 MCG tablet Take 50 mcg by mouth daily before breakfast.      . lidocaine (LIDODERM) 5 % Place 0.5 patches onto the skin daily. Remove & Discard patch within 12 hours or as directed by MD      . metFORMIN (GLUCOPHAGE) 1000 MG tablet Take 1,000 mg by mouth 2 (two) times daily with a meal.      . phenytoin (DILANTIN) 100 MG ER capsule Take 100 mg by mouth 2 (two) times daily.      Marland Kitchen rOPINIRole  (REQUIP) 2 MG tablet Take 2 mg by mouth 3 (three) times daily.      . simvastatin (ZOCOR) 10 MG tablet Take 10 mg by mouth daily.      . sodium chloride (MURO 128) 5 % ophthalmic solution Place 1 drop into both eyes at bedtime.      . timolol (TIMOPTIC) 0.5 % ophthalmic solution Place 1 drop into both eyes daily.      Marland Kitchen warfarin (COUMADIN) 5 MG tablet Take 5-7.5 mg by mouth at bedtime. Takes 7.5mg  on Tues.,Thurs.,Sat. And  all other days        Assessment: Okay for Protocol, INR elevated on admission.  Goal of Therapy:  INR 2-3   Plan:  Warfarin  PO x 1 today Daily INR  Mady Gemma 08/23/2014,2:55 PM

## 2014-08-23 NOTE — Progress Notes (Signed)
UR completed 

## 2014-08-23 NOTE — Evaluation (Signed)
Physical Therapy Evaluation Patient Details Name: Jered Heiny MRN: [id number] DOB: Sep 21, 1935 Today's Date: 08/23/2014   History of Present Illness  79 year old male who    has a past medical history of Hypertension; Diabetes mellitus without complication; Coronary artery disease; S/P triple vessel bypass; and Seizures. today was brought to the ED for worsening back pain. As per patient he fell a week ago while doing the yard work, and has been having pain. Though he is able to walk with help of walker, the pain is severe when he tries to get up from lying position. The pain radiates to left knee and thigh. He denies losing bowel or bladder control. In the ED a CT of the lumbar spine was done which showed subtle compression fracture of L3  Clinical Impression  Pt main limitation is pain.  Therapist explained to pt and wife that this activity is painful and it will take time before the acute pain is gone but the pt must move or he will be susceptible to having secondary problems.  Pt and wife verbalized understanding of the need to get up and move at home.     Follow Up Recommendations Home health PT    Equipment Recommendations  None recommended by PT    Recommendations for Other Services OT consult     Precautions / Restrictions Precautions Precautions: Fall Restrictions Weight Bearing Restrictions: No      Mobility  Bed Mobility Overal bed mobility: Needs Assistance Bed Mobility: Rolling;Sidelying to Sit Rolling: Mod assist Sidelying to sit: Mod assist          Transfers   Equipment used: Rolling walker (2 wheeled)                Ambulation/Gait Ambulation/Gait assistance: Supervision Ambulation Distance (Feet): 35 Feet Assistive device: Rolling walker (2 wheeled)     Gait velocity interpretation: Below normal speed for age/gender                Pertinent Vitals/Pain Pain Assessment: 0-10 Pain Score: 10-Worst pain ever Pain Descriptors /  Indicators: Stabbing Pain Intervention(s): Repositioned    Home Living Family/patient expects to be discharged to:: Private residence Living Arrangements: Spouse/significant other Available Help at Discharge: Family Type of Home: House Home Access: Level entry     Home Layout: One level Home Equipment: Environmental consultant - 2 wheels;Bedside commode      Prior Function Level of Independence: Independent with assistive device(s)                  Extremity/Trunk Assessment               Lower Extremity Assessment: Generalized weakness         Communication   Communication: No difficulties  Cognition Arousal/Alertness: Awake/alert Behavior During Therapy: WFL for tasks assessed/performed Overall Cognitive Status: Within Functional Limits for tasks assessed                      General Comments      Exercises General Exercises - Lower Extremity Ankle Circles/Pumps: Both;10 reps Quad Sets: Both;10 reps Gluteal Sets: Both;10 reps      Assessment/Plan    PT Assessment Patient needs continued PT services  PT Diagnosis Difficulty walking;Generalized weakness;Acute pain   PT Problem List Decreased strength;Decreased activity tolerance;Pain  PT Treatment Interventions Therapeutic exercise;Gait training   PT Goals (Current goals can be found in the Care Plan section) Acute Rehab PT Goals Patient Stated Goal: to be able to  walk again Time For Goal Achievement: 08/24/14 Potential to Achieve Goals: Good    Frequency Min 6X/week           End of Session Equipment Utilized During Treatment: Gait belt Activity Tolerance: Patient limited by pain Patient left: in bed;with call bell/phone within reach;with family/visitor present      Functional Assessment Tool Used: clinical judgement Functional Limitation: Mobility: Walking and moving around Mobility: Walking and Moving Around Current Status ([id number]): At least 60 percent but less than 80 percent impaired,  limited or restricted Mobility: Walking and Moving Around Goal Status [phone]): At least 60 percent but less than 80 percent impaired, limited or restricted Mobility: Walking and Moving Around Discharge Status [phone]): At least 60 percent but less than 80 percent impaired, limited or restricted    Time: 1120-1200 PT Time Calculation (min): 40 min   Charges:   PT Evaluation $Initial PT Evaluation Tier I: 1 Procedure     PT G Codes:   Functional Assessment Tool Used: clinical judgement Functional Limitation: Mobility: Walking and moving around    RUSSELL,CINDY 08/23/2014, 12:46 PM

## 2015-05-26 IMAGING — CR DG LUMBAR SPINE COMPLETE 4+V
5 series · 5 of 5 positions shown · non-contrast
Comparison: No priors.

CLINICAL DATA: Low back pain, left hip pain and left leg pain for
the past week. History of trauma from a fall.

EXAM:
LUMBAR SPINE - COMPLETE 4+ VIEW

[view not recorded (1 of 5)]
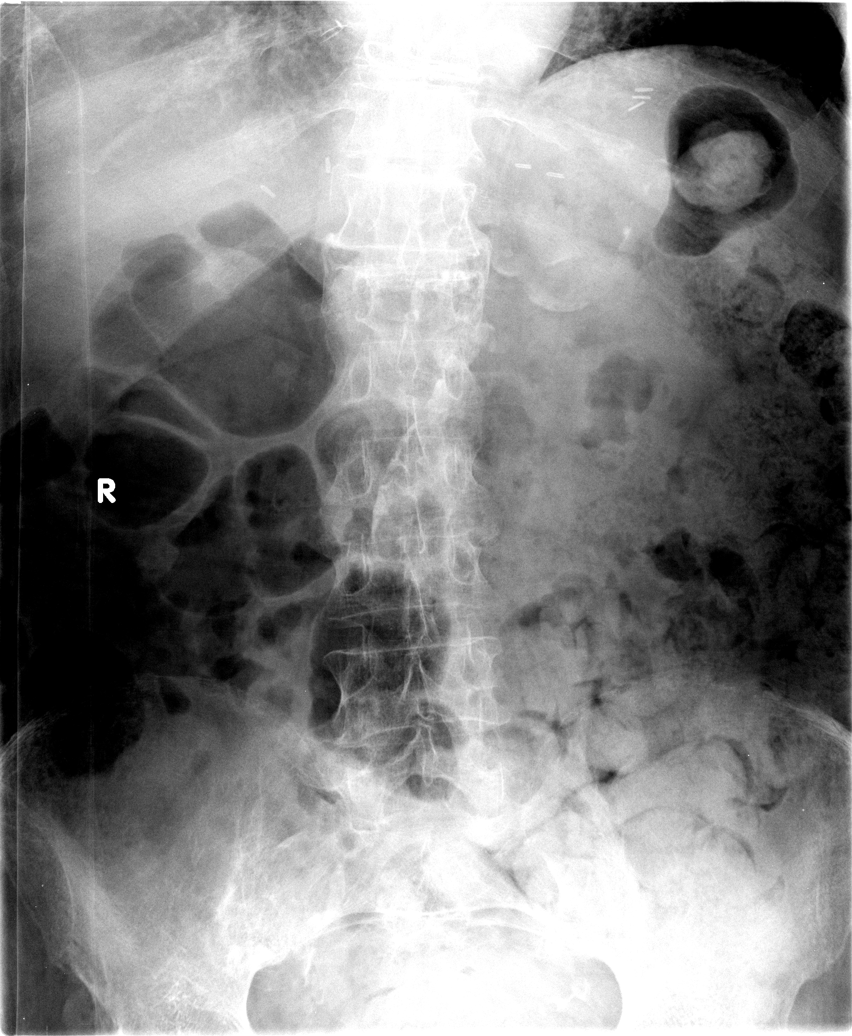

[view not recorded (2 of 5)]
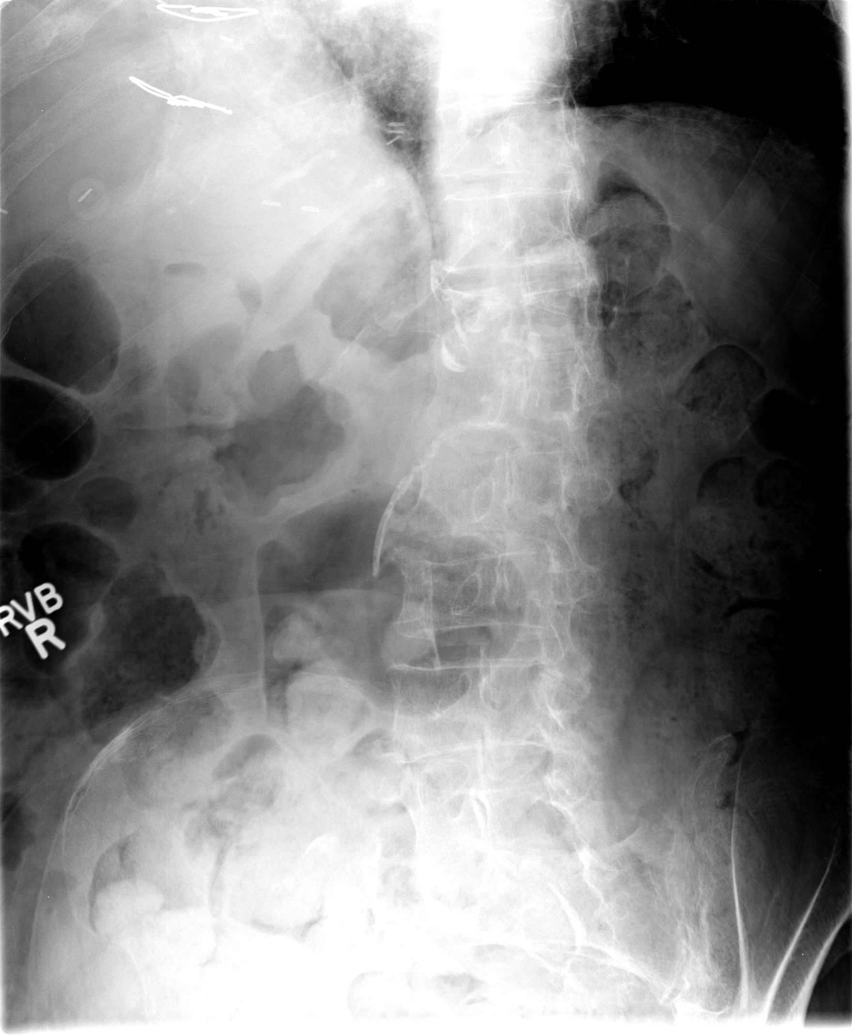

[view not recorded (3 of 5)]
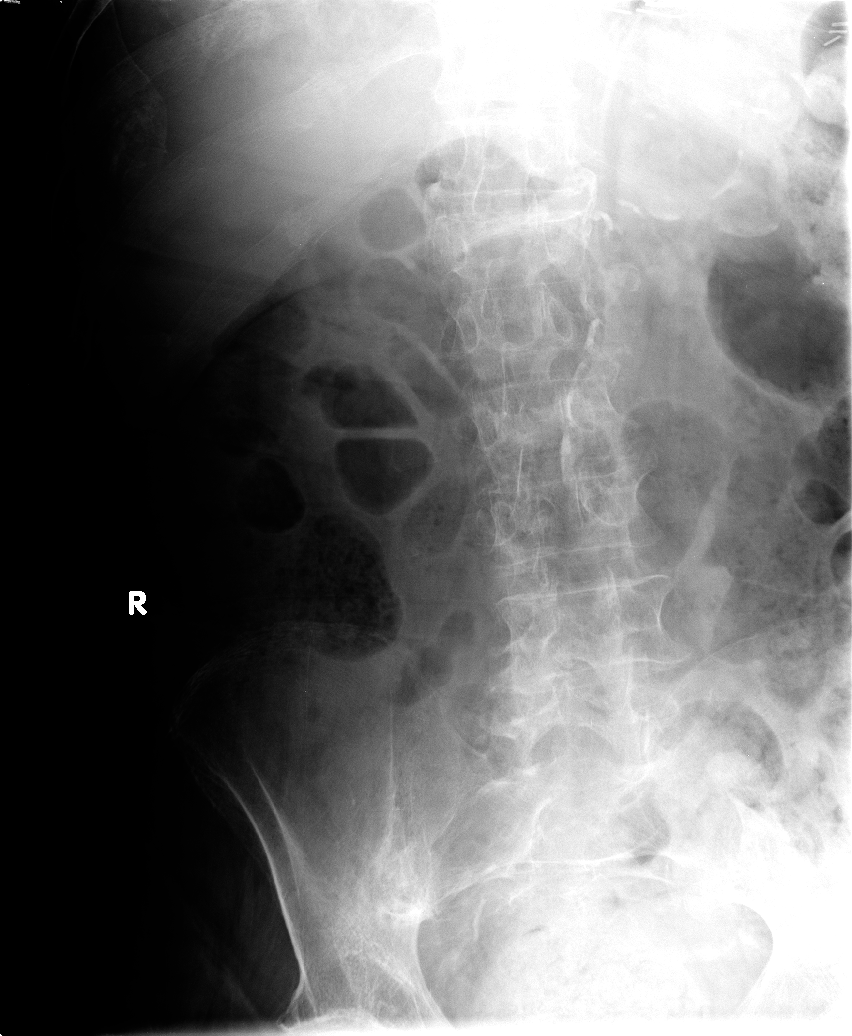

[view not recorded (4 of 5)]
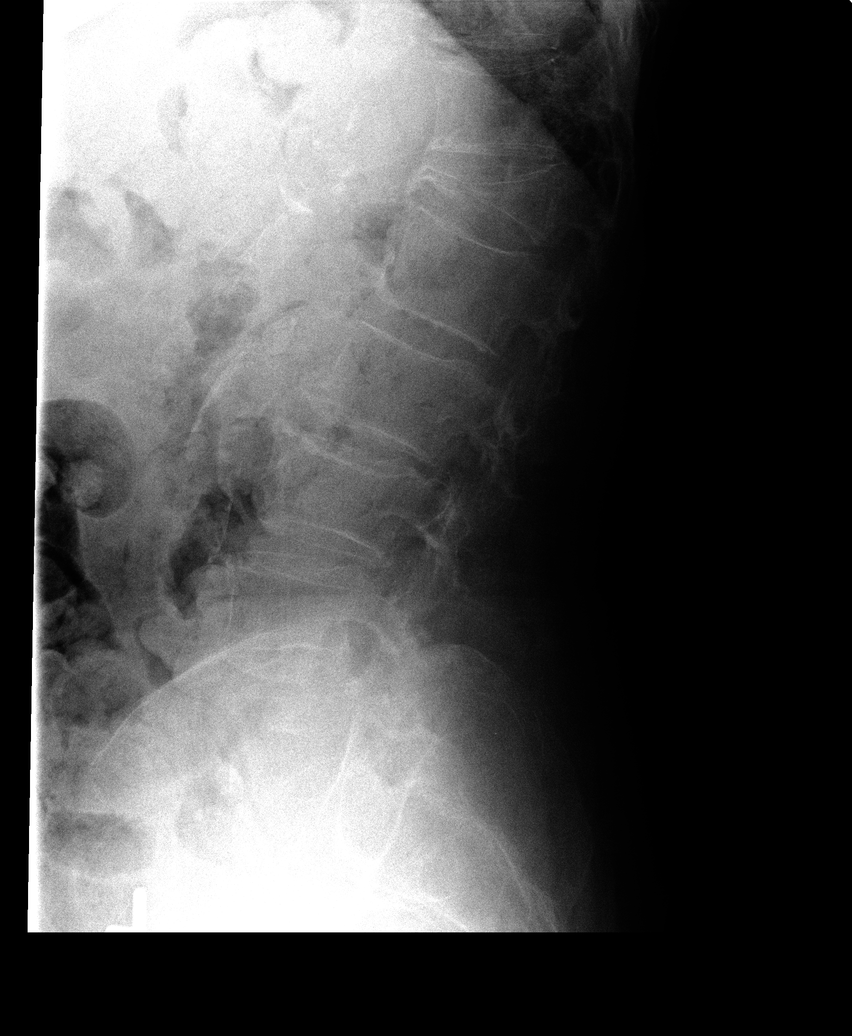

[view not recorded (5 of 5)]
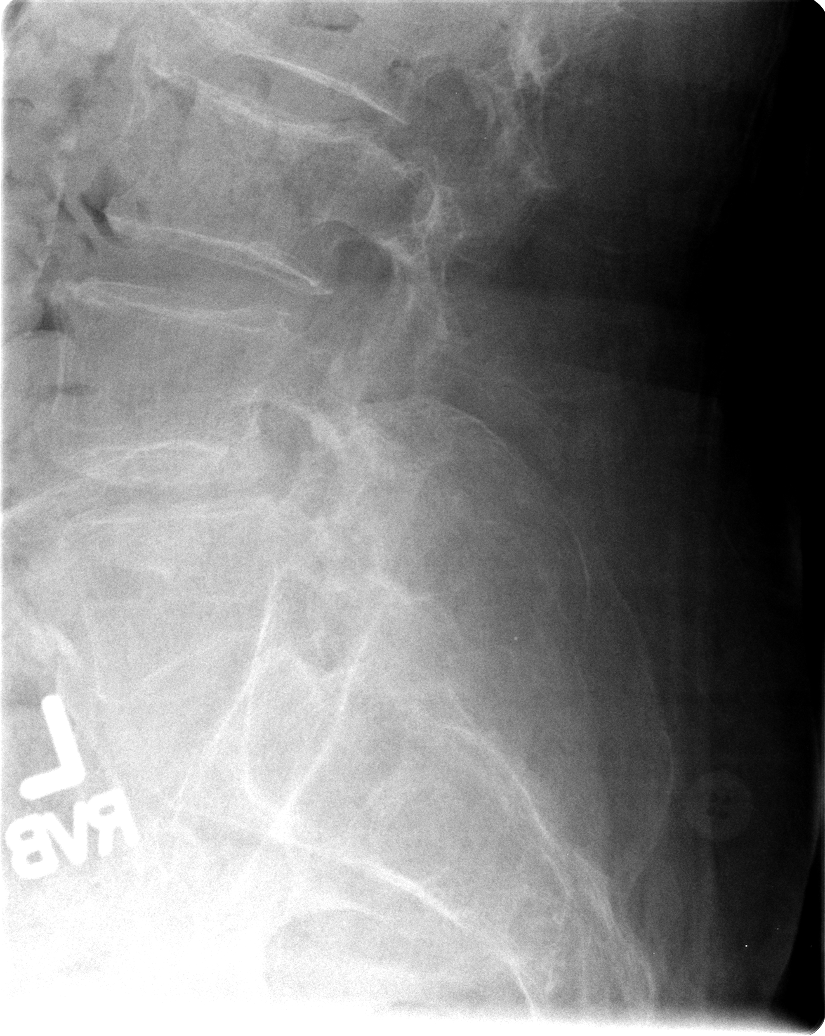

[5 of 5 positions shown; findings below may reference images not displayed]

FINDINGS: There is a severe compression fracture of T12 with complete loss of
anterior vertebral body height. No definite paravertebral soft
tissue swelling is appreciated to strongly suggest an acute injury.
No other definite acute displaced fractures or compression type
fractures are noted. Multilevel degenerative disc disease, most
severe at L2-L3. Mild multilevel facet arthropathy. Median
sternotomy wires are noted. Multiple surgical clips project over the
upper abdomen.
IMPRESSION: 1. Severe compression fracture of T12 with complete loss of anterior
vertebral body height. No surrounding soft tissue thickening to
strongly suggest an acute injury, however, clinical correlation is
recommended as no prior studies are available for comparison. If
there is clinical concern for acute injury at the level of T12, and
point tenderness in this region, further evaluation with CT scan may
be appropriate.

## 2018-07-25 DEATH — deceased
